# Patient Record
Sex: Male | Born: 2005 | Hispanic: No | Marital: Single | Smoking: Never smoker
Health system: Southern US, Community
[De-identification: ages and names within clinical notes are randomized; demographics above are authoritative.]

## PROBLEM LIST (undated history)

## (undated) DIAGNOSIS — J45909 Unspecified asthma, uncomplicated: Secondary | ICD-10-CM

## (undated) DIAGNOSIS — J302 Other seasonal allergic rhinitis: Secondary | ICD-10-CM

---
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
## 2023-12-14 ENCOUNTER — Other Ambulatory Visit (HOSPITAL_BASED_OUTPATIENT_CLINIC_OR_DEPARTMENT_OTHER): Payer: Self-pay

## 2023-12-14 DIAGNOSIS — R0609 Other forms of dyspnea: Secondary | ICD-10-CM

## 2023-12-17 ENCOUNTER — Encounter (HOSPITAL_BASED_OUTPATIENT_CLINIC_OR_DEPARTMENT_OTHER): Payer: Medicaid Other

## 2023-12-31 ENCOUNTER — Ambulatory Visit (HOSPITAL_BASED_OUTPATIENT_CLINIC_OR_DEPARTMENT_OTHER): Payer: Medicaid Other | Admitting: Internal Medicine

## 2023-12-31 DIAGNOSIS — R0609 Other forms of dyspnea: Secondary | ICD-10-CM

## 2023-12-31 LAB — PULMONARY FUNCTION TEST
DL/VA % pred: 130 %
DL/VA: 6.6 ml/min/mmHg/L
DLCO cor % pred: 110 %
DLCO cor: 43.25 ml/min/mmHg
DLCO unc % pred: 110 %
DLCO unc: 43.25 ml/min/mmHg
FEF 25-75 Post: 5.47 L/s
FEF 25-75 Pre: 5.03 L/s
FEF2575-%Change-Post: 8 %
FEF2575-%Pred-Post: 99 %
FEF2575-%Pred-Pre: 91 %
FEV1-%Change-Post: 6 %
FEV1-%Pred-Post: 93 %
FEV1-%Pred-Pre: 87 %
FEV1-Post: 5.07 L
FEV1-Pre: 4.75 L
FEV1FVC-%Change-Post: 4 %
FEV1FVC-%Pred-Pre: 100 %
FEV6-%Change-Post: 2 %
FEV6-%Pred-Post: 87 %
FEV6-%Pred-Pre: 85 %
FEV6-Post: 5.75 L
FEV6-Pre: 5.63 L
FEV6FVC-%Pred-Post: 100 %
FEV6FVC-%Pred-Pre: 100 %
FVC-%Change-Post: 2 %
FVC-%Pred-Post: 87 %
FVC-%Pred-Pre: 85 %
FVC-Post: 5.76 L
FVC-Pre: 5.63 L
Post FEV1/FVC ratio: 88 %
Post FEV6/FVC ratio: 100 %
Pre FEV1/FVC ratio: 84 %
Pre FEV6/FVC Ratio: 100 %
RV % pred: 59 %
RV: 1.04 L
TLC % pred: 82 %
TLC: 6.83 L

## 2023-12-31 NOTE — Patient Instructions (Signed)
 Full PFT Performed Today

## 2023-12-31 NOTE — Progress Notes (Signed)
 Full PFT Performed Today
